# Patient Record
Sex: Male | Born: 1950 | Hispanic: No | Marital: Married | Smoking: Current every day smoker
Health system: Southern US, Community
[De-identification: ages and names within clinical notes are randomized; demographics above are authoritative.]

## PROBLEM LIST (undated history)

## (undated) DIAGNOSIS — E785 Hyperlipidemia, unspecified: Secondary | ICD-10-CM

## (undated) DIAGNOSIS — K219 Gastro-esophageal reflux disease without esophagitis: Secondary | ICD-10-CM

## (undated) DIAGNOSIS — E039 Hypothyroidism, unspecified: Secondary | ICD-10-CM

## (undated) DIAGNOSIS — I10 Essential (primary) hypertension: Secondary | ICD-10-CM

## (undated) HISTORY — PX: HOLEP-LASER ENUCLEATION OF THE PROSTATE WITH MORCELLATION: SHX6641

---

## 2021-01-15 ENCOUNTER — Observation Stay
Admission: EM | Admit: 2021-01-15 | Discharge: 2021-01-17 | Disposition: A | Discharge status: Home / Self Care | Payer: Medicare HMO | Attending: Internal Medicine | Admitting: Internal Medicine

## 2021-01-15 ENCOUNTER — Emergency Department: Payer: Medicare HMO

## 2021-01-15 ENCOUNTER — Other Ambulatory Visit: Payer: Self-pay

## 2021-01-15 DIAGNOSIS — I10 Essential (primary) hypertension: Secondary | ICD-10-CM | POA: Diagnosis not present

## 2021-01-15 DIAGNOSIS — Z9181 History of falling: Secondary | ICD-10-CM | POA: Insufficient documentation

## 2021-01-15 DIAGNOSIS — S79912A Unspecified injury of left hip, initial encounter: Secondary | ICD-10-CM | POA: Diagnosis present

## 2021-01-15 DIAGNOSIS — S3282XA Multiple fractures of pelvis without disruption of pelvic ring, initial encounter for closed fracture: Principal | ICD-10-CM | POA: Insufficient documentation

## 2021-01-15 DIAGNOSIS — Z20822 Contact with and (suspected) exposure to covid-19: Secondary | ICD-10-CM | POA: Diagnosis not present

## 2021-01-15 DIAGNOSIS — R339 Retention of urine, unspecified: Secondary | ICD-10-CM | POA: Diagnosis not present

## 2021-01-15 DIAGNOSIS — K219 Gastro-esophageal reflux disease without esophagitis: Secondary | ICD-10-CM

## 2021-01-15 DIAGNOSIS — E039 Hypothyroidism, unspecified: Secondary | ICD-10-CM

## 2021-01-15 DIAGNOSIS — Z79899 Other long term (current) drug therapy: Secondary | ICD-10-CM | POA: Insufficient documentation

## 2021-01-15 DIAGNOSIS — F17290 Nicotine dependence, other tobacco product, uncomplicated: Secondary | ICD-10-CM | POA: Insufficient documentation

## 2021-01-15 DIAGNOSIS — S32502A Unspecified fracture of left pubis, initial encounter for closed fracture: Secondary | ICD-10-CM

## 2021-01-15 DIAGNOSIS — S32599A Other specified fracture of unspecified pubis, initial encounter for closed fracture: Secondary | ICD-10-CM

## 2021-01-15 DIAGNOSIS — W07XXXA Fall from chair, initial encounter: Secondary | ICD-10-CM | POA: Insufficient documentation

## 2021-01-15 DIAGNOSIS — W19XXXA Unspecified fall, initial encounter: Secondary | ICD-10-CM

## 2021-01-15 DIAGNOSIS — D72829 Elevated white blood cell count, unspecified: Secondary | ICD-10-CM | POA: Diagnosis not present

## 2021-01-15 DIAGNOSIS — R52 Pain, unspecified: Secondary | ICD-10-CM

## 2021-01-15 DIAGNOSIS — R338 Other retention of urine: Secondary | ICD-10-CM

## 2021-01-15 HISTORY — DX: Gastro-esophageal reflux disease without esophagitis: K21.9

## 2021-01-15 HISTORY — DX: Essential (primary) hypertension: I10

## 2021-01-15 HISTORY — DX: Hyperlipidemia, unspecified: E78.5

## 2021-01-15 HISTORY — DX: Hypothyroidism, unspecified: E03.9

## 2021-01-15 NOTE — ED Provider Notes (Incomplete)
[facility] Emergency Department Provider Note   ____________________________________________   Event Date/Time   First MD Initiated Contact with Patient 01/15/21 2355     (approximate)  I have reviewed the triage vital signs and the nursing notes.   HISTORY  Chief Complaint Hip Pain    HPI Steven Warren is a 70 y.o. male who presents to the ED from home status post mechanical fall with left hip pain.  Patient fell out of a chair and onto cement.  Denies striking head or LOC.  Unable to bear weight.  Denies chest pain, shortness of breath, abdominal pain, nausea, vomiting or dizziness.  Denies anticoagulant use.     Past medical history Urinary retention Hyperlipidemia GERD History of ventral hernia There are no problems to display for this patient.   Prior to Admission medications   Not on File    Allergies Patient has no known allergies.  No family history on file.  Social History    Review of Systems {** Revise as appropriate then delete this line - Documentation of 10 systems is required  **} Constitutional: No fever/chills Eyes: No visual changes. ENT: No sore throat. Cardiovascular: Denies chest pain. Respiratory: Denies shortness of breath. Gastrointestinal: No abdominal pain.  No nausea, no vomiting.  No diarrhea.  No constipation. Genitourinary: Negative for dysuria. Musculoskeletal: Negative for back pain. Skin: Negative for rash. Neurological: Negative for headaches, focal weakness or numbness. {**Psychiatric:  Endocrine:  Hematological/Lymphatic:  Allergic/Immunilogical: **}  ____________________________________________   PHYSICAL EXAM:  VITAL SIGNS: ED Triage Vitals  Enc Vitals Group     BP 01/15/21 2301 112/78     Pulse Rate 01/15/21 2301 84     Resp 01/15/21 2301 18     Temp 01/15/21 2301 98.7 F (37.1 C)     Temp Source 01/15/21 2301 Oral     SpO2 01/15/21 2301 98 %     Weight 01/15/21 2300 168 lb  (76.2 kg)     Height 01/15/21 2300 5\' 8"  (1.727 m)     Head Circumference --      Peak Flow --      Pain Score 01/15/21 2259 10     Pain Loc --      Pain Edu? --      Excl. in GC? --    {** Revise as appropriate then delete this line - 8 systems required **} Constitutional: Alert and oriented. Well appearing and in no acute distress. Eyes: Conjunctivae are normal. PERRL. EOMI. Head: Atraumatic. Nose: No congestion/rhinnorhea. Mouth/Throat: Mucous membranes are moist.  Oropharynx non-erythematous. Neck: No stridor.  {**No cervical spine tenderness to palpation.**} {**Hematological/Lymphatic/Immunilogical: No cervical lymphadenopathy. **}Cardiovascular: Normal rate, regular rhythm. Grossly normal heart sounds.  Good peripheral circulation. Respiratory: Normal respiratory effort.  No retractions. Lungs CTAB. Gastrointestinal: Soft and nontender. No distention. No abdominal bruits. No CVA tenderness. {**Genitourinary:  **}Musculoskeletal: No lower extremity tenderness nor edema.  No joint effusions. Neurologic:  Normal speech and language. No gross focal neurologic deficits are appreciated. No gait instability. Skin:  Skin is warm, dry and intact. No rash noted. Psychiatric: Mood and affect are normal. Speech and behavior are normal.  ____________________________________________   LABS (all labs ordered are listed, but only abnormal results are displayed)  Labs Reviewed  CBC WITH DIFFERENTIAL/PLATELET  BASIC METABOLIC PANEL  TROPONIN I (HIGH SENSITIVITY)   ____________________________________________  EKG  *** ____________________________________________  RADIOLOGY I, Amai Cappiello J, personally viewed and evaluated these images (plain radiographs) as part of my medical  decision making, as well as reviewing the written report by the radiologist.  ED MD interpretation:  ***  Official radiology report(s): DG Hip Unilat W or Wo Pelvis 2-3 Views Left  Result Date:  01/15/2021 CLINICAL DATA:  Fall with hip pain EXAM: DG HIP (WITH OR WITHOUT PELVIS) 2-3V LEFT COMPARISON:  None. FINDINGS: Minimally displaced fracture of the left superior pubic ramus and nondisplaced fracture of the left inferior pubic ramus. There is no evidence of arthropathy or other focal bone abnormality. Pelvic phleboliths. Lumbar spondylosis. IMPRESSION: Minimally displaced fracture of the left superior pubic ramus and nondisplaced fracture of the left inferior pubic ramus. Electronically Signed   By: Maudry Mayhew MD   On: 01/15/2021 23:32    ____________________________________________   PROCEDURES  Procedure(s) performed (including Critical Care):  Procedures   ____________________________________________   INITIAL IMPRESSION / ASSESSMENT AND PLAN / ED COURSE  As part of my medical decision making, I reviewed the following data within the electronic MEDICAL RECORD NUMBER {[id number]::"Notes from prior ED visits","[place] Controlled Substance Database"}        ***  Clinical Course as of 01/16/21 0003  Sat Jan 15, 2021  2316 DG Hip Unilat W or Wo Pelvis 2-3 Views Left [JS]    Clinical Course User Index [JS] Irean Hong, MD     ____________________________________________   FINAL CLINICAL IMPRESSION(S) / ED DIAGNOSES  Final diagnoses:  Closed fracture of left pubis, unspecified portion of pubis, initial encounter [facility])     ED Discharge Orders    None      *Please note:  Steven Warren was evaluated in Emergency Department on 01/16/2021 for the symptoms described in the history of present illness. He was evaluated in the context of the global COVID-19 pandemic, which necessitated consideration that the patient might be at risk for infection with the SARS-CoV-2 virus that causes COVID-19. Institutional protocols and algorithms that pertain to the evaluation of patients at risk for COVID-19 are in a state of rapid change based on information released by regulatory bodies  including the CDC and federal and state organizations. These policies and algorithms were followed during the patient's care in the ED.  Some ED evaluations and interventions may be delayed as a result of limited staffing during and the pandemic.*   Note:  This document was prepared using Dragon voice recognition software and may include unintentional dictation errors.

## 2021-01-15 NOTE — ED Provider Notes (Signed)
[facility] Emergency Department Provider Note   ____________________________________________   Event Date/Time   First MD Initiated Contact with Patient 01/15/21 2355     (approximate)  I have reviewed the triage vital signs and the nursing notes.   HISTORY  Chief Complaint Hip Pain    HPI Steven Warren is a 70 y.o. male who presents to the ED from home status post mechanical fall with left hip pain.  Patient fell out of a chair and onto cement.  Denies striking head or LOC.  Unable to bear weight.  Denies chest pain, shortness of breath, abdominal pain, nausea, vomiting or dizziness.  Denies anticoagulant use.     Past medical history Urinary retention Hyperlipidemia GERD History of ventral hernia There are no problems to display for this patient.   Prior to Admission medications   Not on File    Allergies Patient has no known allergies.  No family history on file.  Social History    Review of Systems  Constitutional: No fever/chills Eyes: No visual changes. ENT: No sore throat. Cardiovascular: Denies chest pain. Respiratory: Denies shortness of breath. Gastrointestinal: No abdominal pain.  No nausea, no vomiting.  No diarrhea.  No constipation. Genitourinary: Negative for dysuria. Musculoskeletal: Positive for left hip pain.  Negative for back pain. Skin: Negative for rash. Neurological: Negative for headaches, focal weakness or numbness.   ____________________________________________   PHYSICAL EXAM:  VITAL SIGNS: ED Triage Vitals  Enc Vitals Group     BP 01/15/21 2301 112/78     Pulse Rate 01/15/21 2301 84     Resp 01/15/21 2301 18     Temp 01/15/21 2301 98.7 F (37.1 C)     Temp Source 01/15/21 2301 Oral     SpO2 01/15/21 2301 98 %     Weight 01/15/21 2300 168 lb (76.2 kg)     Height 01/15/21 2300 5\' 8"  (1.727 m)     Head Circumference --      Peak Flow --      Pain Score 01/15/21 2259 10     Pain Loc --       Pain Edu? --      Excl. in GC? --     Constitutional: Alert and oriented. Well appearing and in mild acute distress. Eyes: Conjunctivae are normal. PERRL. EOMI. Head: Atraumatic. Nose: Atraumatic. Mouth/Throat: Mucous membranes are moist.  No dental malocclusion. Neck: No stridor.  No cervical spine tenderness to palpation. Cardiovascular: Normal rate, regular rhythm. Grossly normal heart sounds.  Good peripheral circulation. Respiratory: Normal respiratory effort.  No retractions. Lungs CTAB. Gastrointestinal: Soft and nontender to light or deep palpation. No distention. No abdominal bruits. No CVA tenderness. Musculoskeletal:  LLE: No foreshortening or external rotation.  Pelvis tender to palpation.  Limited range of motion hip secondary to pain.  2+ distal pulses.  Brisk, less than 5-second capillary refill. Neurologic:  Normal speech and language. No gross focal neurologic deficits are appreciated.  Skin:  Skin is warm, dry and intact. No rash noted. Psychiatric: Mood and affect are normal. Speech and behavior are normal.  ____________________________________________   LABS (all labs ordered are listed, but only abnormal results are displayed)  Labs Reviewed  CBC WITH DIFFERENTIAL/PLATELET - Abnormal; Notable for the following components:      Result Value   WBC 17.1 (*)    Neutro Abs 13.4 (*)    Monocytes Absolute 1.2 (*)    Abs Immature Granulocytes 0.20 (*)    All  other components within normal limits  BASIC METABOLIC PANEL - Abnormal; Notable for the following components:   Sodium 133 (*)    Potassium 3.4 (*)    Chloride 96 (*)    All other components within normal limits  RESP PANEL BY RT-PCR (FLU A&B, COVID) ARPGX2  SAMPLE TO BLOOD BANK  TROPONIN I (HIGH SENSITIVITY)  TROPONIN I (HIGH SENSITIVITY)   ____________________________________________  EKG  ED ECG REPORT I, Aubri Gathright J, the attending physician, personally viewed and interpreted this ECG.   Date:  01/16/2021  EKG Time: 0005  Rate: 75  Rhythm: normal EKG, normal sinus rhythm  Axis: Normal  Intervals:none  ST&T Change: Nonspecific  ____________________________________________  RADIOLOGY I, Berta Denson J, personally viewed and evaluated these images (plain radiographs) as part of my medical decision making, as well as reviewing the written report by the radiologist.  ED MD interpretation: Left superior and inferior pubic rami fractures  Official radiology report(s): CT Hip Left Wo Contrast  Result Date: 01/16/2021 CLINICAL DATA:  Left hip pain after fall. Unable to bear weight. Pubic rami fractures on radiograph. EXAM: CT OF THE LEFT HIP WITHOUT CONTRAST TECHNIQUE: Multidetector CT imaging of the left hip was performed according to the standard protocol. Multiplanar CT image reconstructions were also generated. COMPARISON:  None. FINDINGS: Bones/Joint/Cartilage Comminuted and mildly displaced left superior pubic ramus fracture abuts the pubic body, no symphyseal involvement. No acetabular involvement. Minimally comminuted and displaced inferior ramus fracture. The femoral head is well seated in the acetabulum. No femoral or acetabular fracture. There is no hip joint effusion. Ligaments Suboptimally assessed by CT. Muscles and Tendons Minimal obturators stranding adjacent to pubic rami fractures Soft tissues Mild lateral soft tissue edema. The urinary bladder is partially included but markedly distended. Fat in the left inguinal canal. IMPRESSION: 1. Comminuted and mildly displaced left superior and inferior pubic rami fractures. 2. No femoral or acetabular fracture. 3. Markedly distended urinary bladder. Electronically Signed   By: Narda Rutherford M.D.   On: 01/16/2021 00:40   DG Hip Unilat W or Wo Pelvis 2-3 Views Left  Result Date: 01/15/2021 CLINICAL DATA:  Fall with hip pain EXAM: DG HIP (WITH OR WITHOUT PELVIS) 2-3V LEFT COMPARISON:  None. FINDINGS: Minimally displaced fracture of the  left superior pubic ramus and nondisplaced fracture of the left inferior pubic ramus. There is no evidence of arthropathy or other focal bone abnormality. Pelvic phleboliths. Lumbar spondylosis. IMPRESSION: Minimally displaced fracture of the left superior pubic ramus and nondisplaced fracture of the left inferior pubic ramus. Electronically Signed   By: Maudry Mayhew MD   On: 01/15/2021 23:32    ____________________________________________   PROCEDURES  Procedure(s) performed (including Critical Care):  Procedures   ____________________________________________   INITIAL IMPRESSION / ASSESSMENT AND PLAN / ED COURSE  As part of my medical decision making, I reviewed the following data within the electronic MEDICAL RECORD NUMBER History obtained from family, Nursing notes reviewed and incorporated, Labs reviewed, EKG interpreted, Old chart reviewed, Radiograph reviewed and Notes from prior ED visits     70 year old male presenting with left hip pain status post mechanical fall.  Differential diagnosis includes but is not limited to hip fracture, dislocation, pelvic fracture, musculoskeletal contusion, etc.  X-rays demonstrate left pelvic fractures.  Will obtain CT scan for better characterization.  Administer IV morphine for pain.  Obtain basic lab work, COVID swab anticipating patient will require hospitalization for pain control and inability to walk.  Clinical Course as of 01/16/21 0134  Wynelle Link  Jan 16, 2021  0115 Updated patient and spouse on CT results.  Will perform ambulation trial. [JS]  0133 Patient failed ambulation trial. Unable to stand. Will redose analgesia and discuss with hospitalist services for admission. [JS]    Clinical Course User Index [JS] Irean Hong, MD     ____________________________________________   FINAL CLINICAL IMPRESSION(S) / ED DIAGNOSES  Final diagnoses:  Closed fracture of left pubis, unspecified portion of pubis, initial encounter [facility])   Intractable pain     ED Discharge Orders    None      *Please note:  Quashon Jesus was evaluated in Emergency Department on 01/16/2021 for the symptoms described in the history of present illness. He was evaluated in the context of the global COVID-19 pandemic, which necessitated consideration that the patient might be at risk for infection with the SARS-CoV-2 virus that causes COVID-19. Institutional protocols and algorithms that pertain to the evaluation of patients at risk for COVID-19 are in a state of rapid change based on information released by regulatory bodies including the CDC and federal and state organizations. These policies and algorithms were followed during the patient's care in the ED.  Some ED evaluations and interventions may be delayed as a result of limited staffing during and the pandemic.*   Note:  This document was prepared using Dragon voice recognition software and may include unintentional dictation errors.   Irean Hong, MD 01/16/21 [phone]

## 2021-01-15 NOTE — ED Triage Notes (Signed)
Pt reports that his legs got tangled up and fell on cement. He is unable to bear weight.

## 2021-01-16 ENCOUNTER — Encounter: Payer: Self-pay | Admitting: Internal Medicine

## 2021-01-16 ENCOUNTER — Other Ambulatory Visit: Payer: Self-pay

## 2021-01-16 ENCOUNTER — Emergency Department: Payer: Medicare HMO

## 2021-01-16 DIAGNOSIS — E039 Hypothyroidism, unspecified: Secondary | ICD-10-CM

## 2021-01-16 DIAGNOSIS — R338 Other retention of urine: Secondary | ICD-10-CM

## 2021-01-16 DIAGNOSIS — K219 Gastro-esophageal reflux disease without esophagitis: Secondary | ICD-10-CM

## 2021-01-16 DIAGNOSIS — D72829 Elevated white blood cell count, unspecified: Secondary | ICD-10-CM

## 2021-01-16 DIAGNOSIS — S32599A Other specified fracture of unspecified pubis, initial encounter for closed fracture: Secondary | ICD-10-CM | POA: Diagnosis not present

## 2021-01-16 DIAGNOSIS — W19XXXA Unspecified fall, initial encounter: Secondary | ICD-10-CM

## 2021-01-16 DIAGNOSIS — I10 Essential (primary) hypertension: Secondary | ICD-10-CM

## 2021-01-16 LAB — URINALYSIS, COMPLETE (UACMP) WITH MICROSCOPIC
Bacteria, UA: NONE SEEN
Bilirubin Urine: NEGATIVE
Glucose, UA: NEGATIVE mg/dL
Ketones, ur: NEGATIVE mg/dL
Nitrite: NEGATIVE
Protein, ur: NEGATIVE mg/dL
Specific Gravity, Urine: 1.006 (ref 1.005–1.030)
pH: 6 (ref 5.0–8.0)

## 2021-01-16 LAB — CBC WITH DIFFERENTIAL/PLATELET
Abs Immature Granulocytes: 0.2 10*3/uL — ABNORMAL HIGH (ref 0.00–0.07)
Basophils Absolute: 0.1 10*3/uL (ref 0.0–0.1)
Basophils Relative: 1 %
Eosinophils Absolute: 0.3 10*3/uL (ref 0.0–0.5)
Eosinophils Relative: 2 %
HCT: 42.4 % (ref 39.0–52.0)
Hemoglobin: 14.7 g/dL (ref 13.0–17.0)
Immature Granulocytes: 1 %
Lymphocytes Relative: 11 %
Lymphs Abs: 1.9 10*3/uL (ref 0.7–4.0)
MCH: 32 pg (ref 26.0–34.0)
MCHC: 34.7 g/dL (ref 30.0–36.0)
MCV: 92.4 fL (ref 80.0–100.0)
Monocytes Absolute: 1.2 10*3/uL — ABNORMAL HIGH (ref 0.1–1.0)
Monocytes Relative: 7 %
Neutro Abs: 13.4 10*3/uL — ABNORMAL HIGH (ref 1.7–7.7)
Neutrophils Relative %: 78 %
Platelets: 313 10*3/uL (ref 150–400)
RBC: 4.59 MIL/uL (ref 4.22–5.81)
RDW: 13.7 % (ref 11.5–15.5)
WBC: 17.1 10*3/uL — ABNORMAL HIGH (ref 4.0–10.5)
nRBC: 0 % (ref 0.0–0.2)

## 2021-01-16 LAB — BASIC METABOLIC PANEL
Anion gap: 12 (ref 5–15)
BUN: 15 mg/dL (ref 8–23)
CO2: 25 mmol/L (ref 22–32)
Calcium: 10.1 mg/dL (ref 8.9–10.3)
Chloride: 96 mmol/L — ABNORMAL LOW (ref 98–111)
Creatinine, Ser: 0.94 mg/dL (ref 0.61–1.24)
GFR, Estimated: >60 mL/min (ref >60)
Glucose, Bld: 94 mg/dL (ref 70–99)
Potassium: 3.4 mmol/L — ABNORMAL LOW (ref 3.5–5.1)
Sodium: 133 mmol/L — ABNORMAL LOW (ref 135–145)

## 2021-01-16 LAB — RESP PANEL BY RT-PCR (FLU A&B, COVID) ARPGX2
Influenza A by PCR: NEGATIVE
Influenza B by PCR: NEGATIVE
SARS Coronavirus 2 by RT PCR: NEGATIVE

## 2021-01-16 LAB — TROPONIN I (HIGH SENSITIVITY): Troponin I (High Sensitivity): 8 ng/L (ref <18)

## 2021-01-16 MED ORDER — KETOROLAC TROMETHAMINE 30 MG/ML IJ SOLN
15.0000 mg | Freq: Four times a day (QID) | INTRAMUSCULAR | Status: DC | PRN
  Administered 2021-01-17: 15 mg via INTRAVENOUS
  Filled 2021-01-16: qty 1

## 2021-01-16 MED ORDER — HYDROCHLOROTHIAZIDE 25 MG PO TABS
25.0000 mg | ORAL_TABLET | Freq: Every day | ORAL | Status: DC
  Administered 2021-01-16 – 2021-01-17 (×2): 25 mg via ORAL
  Filled 2021-01-16 (×2): qty 1

## 2021-01-16 MED ORDER — ACETAMINOPHEN 325 MG PO TABS: 650.0000 mg | ORAL_TABLET | Freq: Four times a day (QID) | ORAL | Status: DC | PRN

## 2021-01-16 MED ORDER — MORPHINE SULFATE (PF) 4 MG/ML IV SOLN
4.0000 mg | Freq: Once | INTRAVENOUS | Status: AC
  Administered 2021-01-16: 4 mg via INTRAVENOUS
  Filled 2021-01-16: qty 1

## 2021-01-16 MED ORDER — MORPHINE SULFATE (PF) 2 MG/ML IV SOLN
2.0000 mg | INTRAVENOUS | Status: DC | PRN
  Administered 2021-01-16 (×3): 2 mg via INTRAVENOUS
  Filled 2021-01-16 (×3): qty 1

## 2021-01-16 MED ORDER — ONDANSETRON HCL 4 MG/2ML IJ SOLN: 4.0000 mg | Freq: Four times a day (QID) | INTRAMUSCULAR | Status: DC | PRN

## 2021-01-16 MED ORDER — TRAMADOL HCL 50 MG PO TABS: 50.0000 mg | ORAL_TABLET | Freq: Two times a day (BID) | ORAL | 0 refills | Status: DC | PRN

## 2021-01-16 MED ORDER — PANTOPRAZOLE SODIUM 40 MG PO TBEC
40.0000 mg | DELAYED_RELEASE_TABLET | Freq: Every day | ORAL | Status: DC
  Administered 2021-01-16 – 2021-01-17 (×2): 40 mg via ORAL
  Filled 2021-01-16 (×2): qty 1

## 2021-01-16 MED ORDER — IBUPROFEN 800 MG PO TABS: 800.0000 mg | ORAL_TABLET | Freq: Three times a day (TID) | ORAL | 0 refills | Status: DC | PRN

## 2021-01-16 MED ORDER — ACETAMINOPHEN 650 MG RE SUPP: 650.0000 mg | Freq: Four times a day (QID) | RECTAL | Status: DC | PRN

## 2021-01-16 MED ORDER — ENOXAPARIN SODIUM 40 MG/0.4ML IJ SOSY
40.0000 mg | PREFILLED_SYRINGE | INTRAMUSCULAR | Status: DC
  Administered 2021-01-16 – 2021-01-17 (×2): 40 mg via SUBCUTANEOUS
  Filled 2021-01-16 (×2): qty 0.4

## 2021-01-16 MED ORDER — ONDANSETRON HCL 4 MG PO TABS: 4.0000 mg | ORAL_TABLET | Freq: Four times a day (QID) | ORAL | Status: DC | PRN

## 2021-01-16 MED ORDER — HYDROCODONE-ACETAMINOPHEN 5-325 MG PO TABS
1.0000 | ORAL_TABLET | ORAL | Status: DC | PRN
  Administered 2021-01-16 (×2): 1 via ORAL
  Administered 2021-01-16 – 2021-01-17 (×2): 2 via ORAL
  Filled 2021-01-16: qty 1
  Filled 2021-01-16: qty 2
  Filled 2021-01-16: qty 1
  Filled 2021-01-16: qty 2

## 2021-01-16 MED ORDER — KETOROLAC TROMETHAMINE 30 MG/ML IJ SOLN
15.0000 mg | Freq: Once | INTRAMUSCULAR | Status: AC
  Administered 2021-01-16: 15 mg via INTRAVENOUS
  Filled 2021-01-16: qty 1

## 2021-01-16 MED ORDER — LEVOTHYROXINE SODIUM 88 MCG PO TABS
88.0000 ug | ORAL_TABLET | Freq: Every day | ORAL | Status: DC
  Administered 2021-01-16 – 2021-01-17 (×2): 88 ug via ORAL
  Filled 2021-01-16 (×2): qty 1

## 2021-01-16 MED ORDER — BISACODYL 5 MG PO TBEC
5.0000 mg | DELAYED_RELEASE_TABLET | Freq: Every day | ORAL | Status: DC | PRN
Start: 2021-01-16 — End: 2021-01-17

## 2021-01-16 MED ORDER — MORPHINE SULFATE (PF) 4 MG/ML IV SOLN
4.0000 mg | Freq: Once | INTRAVENOUS | Status: AC
Start: 2021-01-16 — End: 2021-01-16
  Administered 2021-01-16: 4 mg via INTRAVENOUS
  Filled 2021-01-16: qty 1

## 2021-01-16 MED ORDER — POTASSIUM CHLORIDE CRYS ER 20 MEQ PO TBCR
40.0000 meq | EXTENDED_RELEASE_TABLET | Freq: Once | ORAL | Status: AC
  Administered 2021-01-16: 40 meq via ORAL
  Filled 2021-01-16: qty 2

## 2021-01-16 MED ORDER — ATORVASTATIN CALCIUM 20 MG PO TABS
40.0000 mg | ORAL_TABLET | Freq: Every day | ORAL | Status: DC
  Administered 2021-01-16 – 2021-01-17 (×2): 40 mg via ORAL
  Filled 2021-01-16 (×2): qty 2

## 2021-01-16 NOTE — TOC Initial Note (Signed)
Transition of Care [facility]) - Initial/Assessment Note    Patient Details  Name: Steven Warren MRN: [id number] Date of Birth: 10/16/1950  [facility] CM/SW Contact:    Maud Deed, LCSW Phone Number: 01/16/2021, 2:18 PM  Clinical Narrative:                 CSW spoke with pt regarding discharge plans and PT recommendations. PT was in agreement with [facility] PT. Pt resides in [place] and will return there upon discharge. CSW began calling agencies in [place] and faxed referral to [facility], waiting on confirmation on if they can accept pt or not.   TOC please follow up with [facility] agencies that will accept pt's insurance.  Expected Discharge Plan: Home w Home Health Services Barriers to Discharge: Continued Medical Work up   Patient Goals and CMS Choice   CMS Medicare.gov Compare Post Acute Care list provided to:: Patient Choice offered to / list presented to : Patient  Expected Discharge Plan and Services Expected Discharge Plan: Home w Home Health Services       Living arrangements for the past 2 months: Single Family Home                                      Prior Living Arrangements/Services Living arrangements for the past 2 months: Single Family Home Lives with:: Spouse Patient language and need for interpreter reviewed:: Yes Do you feel safe going back to the place where you live?: Yes      Need for Family Participation in Patient Care: Yes (Comment) Care giver support system in place?: Yes (comment)   Criminal Activity/Legal Involvement Pertinent to Current Situation/Hospitalization: No - Comment as needed  Activities of Daily Living Home Assistive Devices/Equipment: Eyeglasses ADL Screening (condition at time of admission) Patient's cognitive ability adequate to safely complete daily activities?: Yes Is the patient deaf or have difficulty hearing?: No Does the patient have difficulty seeing, even when wearing  glasses/contacts?: No Does the patient have difficulty concentrating, remembering, or making decisions?: No Patient able to express need for assistance with ADLs?: Yes Does the patient have difficulty dressing or bathing?: Yes Independently performs ADLs?: No Communication: Independent Dressing (OT): Needs assistance Is this a change from baseline?: Change from baseline, expected to last >3 days Grooming: Independent Feeding: Independent Bathing: Needs assistance Is this a change from baseline?: Change from baseline, expected to last >3 days Toileting: Needs assistance Is this a change from baseline?: Change from baseline, expected to last >3days In/Out Bed: Needs assistance Is this a change from baseline?: Change from baseline, expected to last >3 days Walks in Home: Needs assistance Is this a change from baseline?: Change from baseline, expected to last >3 days Does the patient have difficulty walking or climbing stairs?: Yes Weakness of Legs: Left Weakness of Arms/Hands: None  Permission Sought/Granted Permission sought to share information with : Facility Industrial/product designer granted to share information with : Yes, Verbal Permission Granted  Share Information with NAME: Denice     Permission granted to share info w Relationship: spouse  Permission granted to share info w Contact Information: [phone]  Emotional Assessment Appearance:: Other (Comment Required (Unable to assess) Attitude/Demeanor/Rapport: Unable to Assess Affect (typically observed): Unable to Assess Orientation: : Oriented to Self,Oriented to Place,Oriented to  Time,Oriented to Situation   Psych Involvement: No (comment)  Admission diagnosis:  Pelvic  fracture (HCC) [S32.9XXA] Intractable pain [R52] Closed fracture of left pubis, unspecified portion of pubis, initial encounter [facility]) [S32.502A] Patient Active Problem List   Diagnosis Date Noted  . Closed fracture of multiple pubic rami,  initial encounter (HCC) 01/16/2021  . Accidental fall 01/16/2021  . HTN (hypertension) 01/16/2021  . Hypothyroidism 01/16/2021  . GERD (gastroesophageal reflux disease) 01/16/2021  . Leukocytosis 01/16/2021  . Acute urinary retention 01/16/2021   PCP:  [facility]:   [id number] [place] - [place], [place] - [address] [place] [address] Phone: [phone] Fax: [phone]     Social Determinants of Health (SDOH) Interventions    Readmission Risk Interventions No flowsheet data found.

## 2021-01-16 NOTE — Plan of Care (Signed)
  Problem: Education: Goal: Knowledge of General Education information will improve Description: Including pain rating scale, medication(s)/side effects and non-pharmacologic comfort measures Outcome: Progressing   Problem: Clinical Measurements: Goal: Ability to maintain clinical measurements within normal limits will improve Outcome: Progressing   Problem: Coping: Goal: Level of anxiety will decrease Outcome: Progressing   Problem: Elimination: Goal: Will not experience complications related to bowel motility Outcome: Progressing   Problem: Pain Managment: Goal: General experience of comfort will improve Outcome: Progressing   Problem: Safety: Goal: Ability to remain free from injury will improve Outcome: Progressing   Problem: Skin Integrity: Goal: Risk for impaired skin integrity will decrease Outcome: Progressing   

## 2021-01-16 NOTE — ED Notes (Signed)
Pt unable to place any weight on left hip when walking. Pt reports his son had to carry him to the car due to not being able to bear weight.

## 2021-01-16 NOTE — Progress Notes (Signed)
PT Cancellation Note  Patient Details Name: Steven Warren MRN: [id number] DOB: 1950/10/01   Cancelled Treatment:    Reason Eval/Treat Not Completed: Patient not medically ready (Currently awaiting ortho consult d/t new fxs and need for WB clearances and POC. PT will hold until consult and follow up accordingly.)  11:21 AM, 01/16/21 Dewayne Jurek A. Mordecai Maes PT, DPT Physical Therapist - [facility]  Drenda Sobecki A Kamaryn Grimley 01/16/2021, 11:21 AM

## 2021-01-16 NOTE — Consult Note (Signed)
Reason for Consult: Left hip pain secondary to pelvic fracture Referring Physician: Dr. Darrick Huntsman is an 70 y.o. male.  HPI: Patient is a 70 year old who fell while getting up off a chair yesterday.  He lives in the [facility] area and is active and ambulatory without assistive device.  He has had prior orthopedic problems of bilateral biceps tendon ruptures but otherwise active.  He works full-time running a business.  He reports significant left groin pain after his fall.  Past Medical History:  Diagnosis Date  . GERD (gastroesophageal reflux disease)   . HLD (hyperlipidemia)   . HTN (hypertension)   . Hypothyroidism     Past Surgical History:  Procedure Laterality Date  . HOLEP-LASER ENUCLEATION OF THE PROSTATE WITH MORCELLATION      History reviewed. No pertinent family history.  Social History:  reports that he has been smoking cigars. He has never used smokeless tobacco. He reports current alcohol use of about 6.0 standard drinks of alcohol per week. He reports that he does not use drugs.  Allergies: No Known Allergies  Medications: I have reviewed the patient's current medications.  Results for orders placed or performed during the hospital encounter of 01/15/21 (from the past 48 hour(s))  CBC with Differential     Status: Abnormal   Collection Time: 01/15/21 11:55 PM  Result Value Ref Range   WBC 17.1 (H) 4.0 - 10.5 K/uL   RBC 4.59 4.22 - 5.81 MIL/uL   Hemoglobin 14.7 13.0 - 17.0 g/dL   HCT 48.2 50.0 - 37.0 %   MCV 92.4 80.0 - 100.0 fL   MCH 32.0 26.0 - 34.0 pg   MCHC 34.7 30.0 - 36.0 g/dL   RDW 48.8 89.1 - 69.4 %   Platelets 313 150 - 400 K/uL   nRBC 0.0 0.0 - 0.2 %   Neutrophils Relative % 78 %   Neutro Abs 13.4 (H) 1.7 - 7.7 K/uL   Lymphocytes Relative 11 %   Lymphs Abs 1.9 0.7 - 4.0 K/uL   Monocytes Relative 7 %   Monocytes Absolute 1.2 (H) 0.1 - 1.0 K/uL   Eosinophils Relative 2 %   Eosinophils Absolute 0.3 0.0 - 0.5 K/uL    Basophils Relative 1 %   Basophils Absolute 0.1 0.0 - 0.1 K/uL   Immature Granulocytes 1 %   Abs Immature Granulocytes 0.20 (H) 0.00 - 0.07 K/uL    Comment: Performed at [facility], [address] [place], [place] [address]  Basic metabolic panel     Status: Abnormal   Collection Time: 01/15/21 11:55 PM  Result Value Ref Range   Sodium 133 (L) 135 - 145 mmol/L   Potassium 3.4 (L) 3.5 - 5.1 mmol/L   Chloride 96 (L) 98 - 111 mmol/L   CO2 25 22 - 32 mmol/L   Glucose, Bld 94 70 - 99 mg/dL    Comment: Glucose reference range applies only to samples taken after fasting for at least 8 hours.   BUN 15 8 - 23 mg/dL   Creatinine, Ser 8.28 0.61 - 1.24 mg/dL   Calcium 00.3 8.9 - 49.1 mg/dL   GFR, Estimated >79 >15 mL/min    Comment: (NOTE) Calculated using the CKD-EPI Creatinine Equation (2021)    Anion gap 12 5 - 15    Comment: Performed at [facility], [address], [place], [place] [address]  Troponin I (High Sensitivity)     Status: None   Collection Time: 01/15/21  11:55 PM  Result Value Ref Range   Troponin I (High Sensitivity) 8 <18 ng/L    Comment: (NOTE) Elevated high sensitivity troponin I (hsTnI) values and significant  changes across serial measurements may suggest ACS but many other  chronic and acute conditions are known to elevate hsTnI results.  Refer to the "Links" section for chest pain algorithms and additional  guidance. Performed at [facility], [address] [place], [place] [address]   Sample to Blood Bank     Status: None (Preliminary result)   Collection Time: 01/15/21 11:55 PM  Result Value Ref Range   Blood Bank Specimen PENDING    Sample Expiration      01/18/2021,2359 Performed at [facility], [address] [place], [place] [address]   Resp Panel by RT-PCR (Flu A&B, Covid) Nasopharyngeal Swab     Status: None   Collection Time: 01/16/21 12:05 AM   Specimen: Nasopharyngeal Swab; Nasopharyngeal(NP) swabs  in vial transport medium  Result Value Ref Range   SARS Coronavirus 2 by RT PCR NEGATIVE NEGATIVE    Comment: (NOTE) SARS-CoV-2 target nucleic acids are NOT DETECTED.  The SARS-CoV-2 RNA is generally detectable in upper respiratory specimens during the acute phase of infection. The lowest concentration of SARS-CoV-2 viral copies this assay can detect is 138 copies/mL. A negative result does not preclude SARS-Cov-2 infection and should not be used as the sole basis for treatment or other patient management decisions. A negative result may occur with  improper specimen collection/handling, submission of specimen other than nasopharyngeal swab, presence of viral mutation(s) within the areas targeted by this assay, and inadequate number of viral copies(<138 copies/mL). A negative result must be combined with clinical observations, patient history, and epidemiological information. The expected result is Negative.  Fact Sheet for Patients:  BloggerCourse.com  Fact Sheet for Healthcare Providers:  SeriousBroker.it  This test is no t yet approved or cleared by the [place] FDA and  has been authorized for detection and/or diagnosis of SARS-CoV-2 by FDA under an Emergency Use Authorization (EUA). This EUA will remain  in effect (meaning this test can be used) for the duration of the COVID-19 declaration under Section 564(b)(1) of the Act, 21 U.S.C.section 360bbb-3(b)(1), unless the authorization is terminated  or revoked sooner.       Influenza A by PCR NEGATIVE NEGATIVE   Influenza B by PCR NEGATIVE NEGATIVE    Comment: (NOTE) The Xpert Xpress SARS-CoV-2/FLU/RSV plus assay is intended as an aid in the diagnosis of influenza from Nasopharyngeal swab specimens and should not be used as a sole basis for treatment. Nasal washings and aspirates are unacceptable for Xpert Xpress SARS-CoV-2/FLU/RSV testing.  Fact Sheet for  Patients: BloggerCourse.com  Fact Sheet for Healthcare Providers: SeriousBroker.it  This test is not yet approved or cleared by the [place] FDA and has been authorized for detection and/or diagnosis of SARS-CoV-2 by FDA under an Emergency Use Authorization (EUA). This EUA will remain in effect (meaning this test can be used) for the duration of the COVID-19 declaration under Section 564(b)(1) of the Act, 21 U.S.C. section 360bbb-3(b)(1), unless the authorization is terminated or revoked.  Performed at [facility], [address] [place], [place] [address]   Urinalysis, Complete w Microscopic     Status: Abnormal   Collection Time: 01/16/21  3:52 AM  Result Value Ref Range   Color, Urine STRAW (A) YELLOW   APPearance CLEAR (A) CLEAR   Specific Gravity, Urine 1.006 1.005 -  1.030   pH 6.0 5.0 - 8.0   Glucose, UA NEGATIVE NEGATIVE mg/dL   Hgb urine dipstick SMALL (A) NEGATIVE   Bilirubin Urine NEGATIVE NEGATIVE   Ketones, ur NEGATIVE NEGATIVE mg/dL   Protein, ur NEGATIVE NEGATIVE mg/dL   Nitrite NEGATIVE NEGATIVE   Leukocytes,Ua SMALL (A) NEGATIVE   RBC / HPF 0-5 0 - 5 RBC/hpf   WBC, UA 21-50 0 - 5 WBC/hpf   Bacteria, UA NONE SEEN NONE SEEN   Squamous Epithelial / LPF 0-5 0 - 5   Hyaline Casts, UA PRESENT     Comment: Performed at [facility], [address], [place], [place] [address]    CT Hip Left Wo Contrast  Result Date: 01/16/2021 CLINICAL DATA:  Left hip pain after fall. Unable to bear weight. Pubic rami fractures on radiograph. EXAM: CT OF THE LEFT HIP WITHOUT CONTRAST TECHNIQUE: Multidetector CT imaging of the left hip was performed according to the standard protocol. Multiplanar CT image reconstructions were also generated. COMPARISON:  None. FINDINGS: Bones/Joint/Cartilage Comminuted and mildly displaced left superior pubic ramus fracture abuts the pubic body, no symphyseal involvement. No  acetabular involvement. Minimally comminuted and displaced inferior ramus fracture. The femoral head is well seated in the acetabulum. No femoral or acetabular fracture. There is no hip joint effusion. Ligaments Suboptimally assessed by CT. Muscles and Tendons Minimal obturators stranding adjacent to pubic rami fractures Soft tissues Mild lateral soft tissue edema. The urinary bladder is partially included but markedly distended. Fat in the left inguinal canal. IMPRESSION: 1. Comminuted and mildly displaced left superior and inferior pubic rami fractures. 2. No femoral or acetabular fracture. 3. Markedly distended urinary bladder. Electronically Signed   By: Narda Rutherford M.D.   On: 01/16/2021 00:40   DG Hip Unilat W or Wo Pelvis 2-3 Views Left  Result Date: 01/15/2021 CLINICAL DATA:  Fall with hip pain EXAM: DG HIP (WITH OR WITHOUT PELVIS) 2-3V LEFT COMPARISON:  None. FINDINGS: Minimally displaced fracture of the left superior pubic ramus and nondisplaced fracture of the left inferior pubic ramus. There is no evidence of arthropathy or other focal bone abnormality. Pelvic phleboliths. Lumbar spondylosis. IMPRESSION: Minimally displaced fracture of the left superior pubic ramus and nondisplaced fracture of the left inferior pubic ramus. Electronically Signed   By: Maudry Mayhew MD   On: 01/15/2021 23:32    Review of Systems Blood pressure 126/83, pulse 73, temperature 98.1 F (36.7 C), resp. rate 16, height 5\' 8"  (1.727 m), weight 76.2 kg, SpO2 98 %. Physical Exam He has no pain with logrolling of the left lower extremity is tender to palpation at the pubis on the left side.  He has strong pulses and is able flex extend the toes without difficulty negative Homans. Assessment/Plan: Minimally displaced comminuted pubic rami fractures on the left side near the pubic symphysis.  He will be weightbearing as tolerated and will need a walker.  Discussed that this usually takes about a month to be  comfortable and he can follow-up with his local orthopedist regarding this in 3 to 4 weeks.  01/16/2021, 11:28 AM

## 2021-01-16 NOTE — Progress Notes (Signed)
Same day rounding progress note  Patient seen and examined.  Chart reviewed.  Case discussed with orthopedic and nursing.  Family updated at bedside on 5/15  Minimally displaced comminuted pubic rami fracture: Appreciate orthopedic input.  Weightbearing as tolerated, PT, OT eval  Acute urinary retention Required Foley catheterization on admission.  We will give voiding trial today after removing Foley.  Hopefully he will need to get this reinserted  Time spent: 20 minutes

## 2021-01-16 NOTE — Evaluation (Signed)
Physical Therapy Evaluation Patient Details Name: Steven Warren MRN: [id number] DOB: Mar 08, 1951 Today's Date: 01/16/2021   History of Present Illness  Marquelle Musgrave is a 70 y.o. male who presents to the ED from home status post mechanical fall with left hip pain.  Patient fell out of a chair and onto cement.  Denies striking head or LOC. He has PMHx significant for urinary retention, HLD, GERD, and hx of ventral hernia  Clinical Impression  The pt presents this session with good motivation to participate. Currently he is highly limited d/t pain tolerance. The pt is able to tolerant bed mobility and gait training, however reports 9/10 pain at the end of session, RN notified. The pt will require additional gait and stair training in order to safely d/c home with family care and HHPT. Pt and family educated on potential need for downstairs living until able to tolerate x10 steps. PT will continue to follow.     Follow Up Recommendations Home health PT;Supervision/Assistance - 24 hour    Equipment Recommendations       Recommendations for Other Services       Precautions / Restrictions Precautions Precautions: Fall Restrictions Weight Bearing Restrictions: Yes LLE Weight Bearing: Weight bearing as tolerated      Mobility  Bed Mobility Overal bed mobility: Needs Assistance Bed Mobility: Rolling;Supine to Sit Rolling: Min assist (Roll to R with pillow placed between legs)   Supine to sit: Min assist     General bed mobility comments: Limited 2/2 pain. HOB flat    Transfers Overall transfer level: Needs assistance Equipment used: Rolling walker (2 wheeled) Transfers: Sit to/from Stand Sit to Stand: Min guard            Ambulation/Gait Ambulation/Gait assistance: Min guard Gait Distance (Feet): 25 Feet Assistive device: Rolling walker (2 wheeled) Gait Pattern/deviations: Step-to pattern;Decreased step length - right        Stairs            Wheelchair  Mobility    Modified Rankin (Stroke Patients Only)       Balance Overall balance assessment: Needs assistance Sitting-balance support: Bilateral upper extremity supported Sitting balance-Leahy Scale: Fair     Standing balance support: Bilateral upper extremity supported Standing balance-Leahy Scale: Fair                               Pertinent Vitals/Pain Pain Assessment: 0-10 Pain Score: 7  Pain Location: L groin region Pain Descriptors / Indicators: Tightness Pain Intervention(s): Monitored during session;Limited activity within patient's tolerance;Patient requesting pain meds-RN notified (Pt also requesting ice with pain medication-RN notified.)    Home Living Family/patient expects to be discharged to:: Private residence Living Arrangements: Spouse/significant other Available Help at Discharge: Family;Available 24 hours/day Type of Home: House Home Access: Stairs to enter Entrance Stairs-Rails: None Entrance Stairs-Number of Steps: 2 Home Layout: Two level Home Equipment: Environmental consultant - 2 wheels      Prior Function Level of Independence: Independent               Hand Dominance   Dominant Hand: Right    Extremity/Trunk Assessment   Upper Extremity Assessment Upper Extremity Assessment: Overall WFL for tasks assessed    Lower Extremity Assessment Lower Extremity Assessment: RLE deficits/detail;LLE deficits/detail RLE Deficits / Details: Limited SLR d/t L pelvis pain, otherwise WFL LLE Deficits / Details: Limited hip AROM, ankle 5/5       Communication  Communication: No difficulties  Cognition Arousal/Alertness: Awake/alert Behavior During Therapy: WFL for tasks assessed/performed Overall Cognitive Status: Within Functional Limits for tasks assessed                                        General Comments      Exercises Total Joint Exercises Quad Sets: AROM;Both;Supine;Left (d/t pain patient educated and  demonstrated, but did not complete a specific ex rx) Knee Flexion: AROM;Left;Supine (d/t pain patient educated and demonstrated, but did not complete a specific ex rx) Marching in Standing: AROM;Standing;10 reps Other Exercises Other Exercises: Hip IR/ER: LLE; d/t pain patient educated and demonstrated, but did not complete a specific ex rx   Assessment/Plan    PT Assessment Patient needs continued PT services  PT Problem List Decreased activity tolerance;Decreased mobility;Decreased strength;Decreased balance       PT Treatment Interventions Stair training;Balance training;Patient/family education;Therapeutic exercise;Gait training;Therapeutic activities    PT Goals (Current goals can be found in the Care Plan section)  Acute Rehab PT Goals Patient Stated Goal: to return home PT Goal Formulation: With patient Time For Goal Achievement: 01/30/21 Potential to Achieve Goals: Good    Frequency 7X/week   Barriers to discharge        Co-evaluation               AM-PAC PT "6 Clicks" Mobility  Outcome Measure Help needed turning from your back to your side while in a flat bed without using bedrails?: A Lot Help needed moving from lying on your back to sitting on the side of a flat bed without using bedrails?: A Lot Help needed moving to and from a bed to a chair (including a wheelchair)?: A Little Help needed standing up from a chair using your arms (e.g., wheelchair or bedside chair)?: A Little Help needed to walk in hospital room?: A Little Help needed climbing 3-5 steps with a railing? : A Lot 6 Click Score: 15    End of Session   Activity Tolerance: Patient tolerated treatment well;Patient limited by pain Patient left: in bed;with call bell/phone within reach;with bed alarm set Nurse Communication: Mobility status;Patient requests pain meds PT Visit Diagnosis: Unsteadiness on feet (R26.81);Pain;History of falling (Z91.81);Difficulty in walking, not elsewhere classified  (R26.2) Pain - Right/Left: Left Pain - part of body: Hip    Time: 2330-0762 PT Time Calculation (min) (ACUTE ONLY): 36 min   Charges:   PT Evaluation $PT Eval Moderate Complexity: 1 Mod PT Treatments $Gait Training: 23-37 mins        12:36 PM, 01/16/21 Perla Echavarria A. Mordecai Maes PT, DPT Physical Therapist - [facility]   Carely Nappier A Dorna Mallet 01/16/2021, 12:33 PM

## 2021-01-16 NOTE — H&P (Signed)
History and Physical    Steven Warren KZS:[id number] DOB: 04/29/1951 DOA: 01/15/2021  PCP: [facility]   Patient coming from: Home  I have personally briefly reviewed patient's old medical records in [facility]  Chief Complaint: Fall, left hip pain  HPI: Steven Warren is a 70 y.o. male with medical history significant for Visiting the area from [place] with history of HTN, hypothyroidism, GERD, status post recent HoLEP who presents to the ED following an accidental fall.  Patient was in his usual state of health and said he pushed himself up from a chair but lost his footing falling onto his left hip with sudden onset of pain and was unable to help himself up and bear weight on his left leg thereafter.  He denies preceding lightheadedness, palpitations or chest pain or shortness of breath.  Had no preceding headache, visual disturbance or one-sided weakness, numbness or tingling.  Had no recent cough, fever or chills, abdominal pain, nausea vomiting or change in bowel habits. ED course: On arrival, afebrile, BP 112/78, pulse 84, O2 sat 98% on room air.  WBC 17,000, sodium 133, potassium 3.4.  Troponin 8.  COVID and flu negative EKG, personally viewed and interpreted: Normal sinus rhythm at 75 with nonspecific ST-T wave changes Imaging: CT left hip: Comminuted and mildly displaced left superior and inferior pubic rami fractures, no femoral acetabular fracture.  Markedly distended urinary bladder  Patient failed ambulation trial in the ER.  Hospitalist consulted for admission.  Review of Systems: As per HPI otherwise all other systems on review of systems negative.    Past Medical History:  Diagnosis Date  . GERD (gastroesophageal reflux disease)   . HLD (hyperlipidemia)   . HTN (hypertension)   . Hypothyroidism     Past Surgical History:  Procedure Laterality Date  . HOLEP-LASER ENUCLEATION OF THE PROSTATE WITH MORCELLATION       reports that he  has been smoking cigars. He has never used smokeless tobacco. He reports current alcohol use of about 6.0 standard drinks of alcohol per week. He reports that he does not use drugs.  No Known Allergies  History reviewed. No pertinent family history.    Prior to Admission medications   Medication Sig Start Date End Date Taking? Authorizing Provider  amLODipine-benazepril (LOTREL) 10-20 MG capsule Take 1 capsule by mouth daily. 11/17/20   [provider]  atorvastatin (LIPITOR) 80 MG tablet Take 40 mg by mouth daily. 01/03/21   [provider]  gabapentin (NEURONTIN) 300 MG capsule Take 1 capsule by mouth 3 (three) times daily. 01/01/21   [provider]  hydrochlorothiazide (HYDRODIURIL) 25 MG tablet Take 1 tablet by mouth daily. 01/11/21   [provider]  levothyroxine (SYNTHROID) 88 MCG tablet Take 88 mcg by mouth daily. 10/24/20   [provider]  omeprazole (PRILOSEC) 40 MG capsule Take 1 capsule by mouth daily. 01/12/21   [provider]    Physical Exam: Vitals:   01/16/21 0030 01/16/21 0100 01/16/21 0130 01/16/21 0200  BP: 131/90 118/85 119/83 121/75  Pulse: 81 87 85 89  Resp: 19 16 14 18   Temp:      TempSrc:      SpO2: 97% 92% 94% 95%  Weight:      Height:         Vitals:   01/16/21 0030 01/16/21 0100 01/16/21 0130 01/16/21 0200  BP: 131/90 118/85 119/83 121/75  Pulse: 81 87 85 89  Resp: 19 16 14  18  Temp:      TempSrc:      SpO2: 97% 92% 94% 95%  Weight:      Height:          Constitutional: Alert and oriented x 3 . Not in any apparent distress HEENT:      Head: Normocephalic and atraumatic.         Eyes: PERLA, EOMI, Conjunctivae are normal. Sclera is non-icteric.       Mouth/Throat: Mucous membranes are moist.       Neck: Supple with no signs of meningismus. Cardiovascular: Regular rate and rhythm. No murmurs, gallops, or rubs. 2+ symmetrical distal pulses are present . No JVD. No LE edema Respiratory:  Respiratory effort normal .Lungs sounds clear bilaterally. No wheezes, crackles, or rhonchi.  Gastrointestinal: Soft, non tender, mild distention with suprapubic discomfort on palpation, normal positive bowel sounds.  Genitourinary: No CVA tenderness. Musculoskeletal:  Pain on palpation left hip. No cyanosis, or erythema of extremities. Neurologic:  Face is symmetric. Moving all extremities. No gross focal neurologic deficits . Skin: Skin is warm, dry.  No rash or ulcers Psychiatric: Mood and affect are normal    Labs on Admission: I have personally reviewed following labs and imaging studies  CBC: Recent Labs  Lab 01/15/21 2355  WBC 17.1*  NEUTROABS 13.4*  HGB 14.7  HCT 42.4  MCV 92.4  PLT 313   Basic Metabolic Panel: Recent Labs  Lab 01/15/21 2355  NA 133*  K 3.4*  CL 96*  CO2 25  GLUCOSE 94  BUN 15  CREATININE 0.94  CALCIUM 10.1   GFR: Estimated Creatinine Clearance: 73.8 mL/min (by C-G formula based on SCr of 0.94 mg/dL). Liver Function Tests: No results for input(s): AST, ALT, ALKPHOS, BILITOT, PROT, ALBUMIN in the last 168 hours. No results for input(s): LIPASE, AMYLASE in the last 168 hours. No results for input(s): AMMONIA in the last 168 hours. Coagulation Profile: No results for input(s): INR, PROTIME in the last 168 hours. Cardiac Enzymes: No results for input(s): CKTOTAL, CKMB, CKMBINDEX, TROPONINI in the last 168 hours. BNP (last 3 results) No results for input(s): PROBNP in the last 8760 hours. HbA1C: No results for input(s): HGBA1C in the last 72 hours. CBG: No results for input(s): GLUCAP in the last 168 hours. Lipid Profile: No results for input(s): CHOL, HDL, LDLCALC, TRIG, CHOLHDL, LDLDIRECT in the last 72 hours. Thyroid Function Tests: No results for input(s): TSH, T4TOTAL, FREET4, T3FREE, THYROIDAB in the last 72 hours. Anemia Panel: No results for input(s): VITAMINB12, FOLATE, FERRITIN, TIBC, IRON, RETICCTPCT in the last 72 hours. Urine  analysis: No results found for: COLORURINE, APPEARANCEUR, LABSPEC, PHURINE, GLUCOSEU, HGBUR, BILIRUBINUR, KETONESUR, PROTEINUR, UROBILINOGEN, NITRITE, LEUKOCYTESUR  Radiological Exams on Admission: CT Hip Left Wo Contrast  Result Date: 01/16/2021 CLINICAL DATA:  Left hip pain after fall. Unable to bear weight. Pubic rami fractures on radiograph. EXAM: CT OF THE LEFT HIP WITHOUT CONTRAST TECHNIQUE: Multidetector CT imaging of the left hip was performed according to the standard protocol. Multiplanar CT image reconstructions were also generated. COMPARISON:  None. FINDINGS: Bones/Joint/Cartilage Comminuted and mildly displaced left superior pubic ramus fracture abuts the pubic body, no symphyseal involvement. No acetabular involvement. Minimally comminuted and displaced inferior ramus fracture. The femoral head is well seated in the acetabulum. No femoral or acetabular fracture. There is no hip joint effusion. Ligaments Suboptimally assessed by CT. Muscles and Tendons Minimal obturators stranding adjacent to pubic rami fractures Soft tissues Mild lateral soft tissue edema. The urinary  bladder is partially included but markedly distended. Fat in the left inguinal canal. IMPRESSION: 1. Comminuted and mildly displaced left superior and inferior pubic rami fractures. 2. No femoral or acetabular fracture. 3. Markedly distended urinary bladder. Electronically Signed   By: Narda Rutherford M.D.   On: 01/16/2021 00:40   DG Hip Unilat W or Wo Pelvis 2-3 Views Left  Result Date: 01/15/2021 CLINICAL DATA:  Fall with hip pain EXAM: DG HIP (WITH OR WITHOUT PELVIS) 2-3V LEFT COMPARISON:  None. FINDINGS: Minimally displaced fracture of the left superior pubic ramus and nondisplaced fracture of the left inferior pubic ramus. There is no evidence of arthropathy or other focal bone abnormality. Pelvic phleboliths. Lumbar spondylosis. IMPRESSION: Minimally displaced fracture of the left superior pubic ramus and nondisplaced  fracture of the left inferior pubic ramus. Electronically Signed   By: Maudry Mayhew MD   On: 01/15/2021 23:32     Assessment/Plan 70 year old male, visiting from [place], with history of HTN, hypothyroidism, GERD, status post recent HoLEP presenting following an accidental fall onto left hip with inability to bear weight on hip.      Closed fracture of multiple pubic rami, initial encounter (HCC)   Accidental fall -CT left hip: Comminuted and mildly displaced left superior and inferior pubic rami fractures - Pain control - PT and TOC consult - Ortho consult in the morning for additional recommendations - Patient would prefer to continue rehab in [place]    Acute urinary retention   Recent HoLEP   Leukocytosis - CT hip showed markedly distended urinary bladder and patient had recent HoLEP - Leukocytosis of 17,000 - Bladder scan and urinalysis ordered following admission with plan to straight cath if greater than 300     HTN (hypertension) - Continue HCTZ    Hypothyroidism - Continue levothyroxine    GERD (gastroesophageal reflux disease) - Pantoprazole    DVT prophylaxis: Lovenox  Code Status: full code  Family Communication:  none  Disposition Plan: Back to previous home environment Consults called: none  Status: Observation    Andris Baumann MD [facility]     01/16/2021, 2:31 AM

## 2021-01-17 DIAGNOSIS — R52 Pain, unspecified: Secondary | ICD-10-CM | POA: Diagnosis not present

## 2021-01-17 DIAGNOSIS — S32502A Unspecified fracture of left pubis, initial encounter for closed fracture: Secondary | ICD-10-CM | POA: Diagnosis not present

## 2021-01-17 DIAGNOSIS — W19XXXA Unspecified fall, initial encounter: Secondary | ICD-10-CM | POA: Diagnosis not present

## 2021-01-17 DIAGNOSIS — S32599A Other specified fracture of unspecified pubis, initial encounter for closed fracture: Secondary | ICD-10-CM | POA: Diagnosis not present

## 2021-01-17 LAB — BASIC METABOLIC PANEL
Anion gap: 6 (ref 5–15)
BUN: 13 mg/dL (ref 8–23)
CO2: 27 mmol/L (ref 22–32)
Calcium: 9.4 mg/dL (ref 8.9–10.3)
Chloride: 101 mmol/L (ref 98–111)
Creatinine, Ser: 0.85 mg/dL (ref 0.61–1.24)
GFR, Estimated: >60 mL/min (ref >60)
Glucose, Bld: 112 mg/dL — ABNORMAL HIGH (ref 70–99)
Potassium: 3.9 mmol/L (ref 3.5–5.1)
Sodium: 134 mmol/L — ABNORMAL LOW (ref 135–145)

## 2021-01-17 LAB — CBC
HCT: 40.1 % (ref 39.0–52.0)
Hemoglobin: 13.9 g/dL (ref 13.0–17.0)
MCH: 32.1 pg (ref 26.0–34.0)
MCHC: 34.7 g/dL (ref 30.0–36.0)
MCV: 92.6 fL (ref 80.0–100.0)
Platelets: 248 10*3/uL (ref 150–400)
RBC: 4.33 MIL/uL (ref 4.22–5.81)
RDW: 13.8 % (ref 11.5–15.5)
WBC: 8.5 10*3/uL (ref 4.0–10.5)
nRBC: 0 % (ref 0.0–0.2)

## 2021-01-17 LAB — HIV ANTIBODY (ROUTINE TESTING W REFLEX): HIV Screen 4th Generation wRfx: NONREACTIVE

## 2021-01-17 MED ORDER — IBUPROFEN 800 MG PO TABS: 800.0000 mg | ORAL_TABLET | Freq: Three times a day (TID) | ORAL | 0 refills | Status: AC | PRN

## 2021-01-17 MED ORDER — TRAMADOL HCL 50 MG PO TABS: 50.0000 mg | ORAL_TABLET | Freq: Two times a day (BID) | ORAL | 0 refills | Status: AC | PRN

## 2021-01-17 NOTE — Progress Notes (Signed)
Physical Therapy Treatment Patient Details Name: Steven Warren MRN: [id number] DOB: 1951/08/23 Today's Date: 01/17/2021    History of Present Illness Steven Warren is a 70 y.o. male who presents to the ED from home status post mechanical fall with left hip pain.  Patient fell out of a chair and onto cement.  Denies striking head or LOC. He has PMHx significant for urinary retention, HLD, GERD, and hx of ventral hernia    PT Comments    Pt appears to be moving with more ease this am.  Pain level has decreased to 7/10 with weight bearing activities without receiving pain meds.  Pt tolerated 60ft x 2 of gait training to and from rehab gym.  Negotiated 4 steps with bilateral rails and CGA.  Pt's son will assist him on Left side upon returning home since he only has a single rail at home.  Pt however has a good understanding of technique and will premedicate prior to arriving home.  Plan for continued PT services via Home Health.  No equipment needs at this time.     Follow Up Recommendations  Home health PT;Supervision/Assistance - 24 hour     Equipment Recommendations       Recommendations for Other Services       Precautions / Restrictions Precautions Precautions: Fall Restrictions Weight Bearing Restrictions: Yes LLE Weight Bearing: Weight bearing as tolerated    Mobility  Bed Mobility Overal bed mobility: Needs Assistance Bed Mobility: Rolling;Supine to Sit Rolling: Min assist   Supine to sit: Min assist     General bed mobility comments: MinA for L LE management    Transfers Overall transfer level: Needs assistance Equipment used: Rolling walker (2 wheeled) Transfers: Sit to/from Stand Sit to Stand: Supervision            Ambulation/Gait Ambulation/Gait assistance: Supervision Gait Distance (Feet): 100 Feet Assistive device: Rolling walker (2 wheeled) Gait Pattern/deviations: Step-to pattern;Decreased step length - right     General Gait Details: 5ft  2   Stairs Stairs: Yes Stairs assistance: Min guard Stair Management: Two rails;Step to pattern Number of Stairs: 4 General stair comments: Pt has a single Right sided rail at home with 10 steps, his son will help with support on the Left side, he is also capable of utilizing two hands on Right rail for stair negotiation.   Wheelchair Mobility    Modified Rankin (Stroke Patients Only)       Balance                                            Cognition Arousal/Alertness: Awake/alert Behavior During Therapy: WFL for tasks assessed/performed Overall Cognitive Status: Within Functional Limits for tasks assessed                                        Exercises      General Comments        Pertinent Vitals/Pain Pain Assessment: 0-10 Pain Score: 7  Pain Location: L groin region Pain Descriptors / Indicators: Discomfort Pain Intervention(s): Monitored during session    Home Living                      Prior Function  PT Goals (current goals can now be found in the care plan section) Acute Rehab PT Goals Patient Stated Goal: to return home Progress towards PT goals: Progressing toward goals    Frequency    7X/week      PT Plan Current plan remains appropriate    Co-evaluation              AM-PAC PT "6 Clicks" Mobility   Outcome Measure  Help needed turning from your back to your side while in a flat bed without using bedrails?: A Little Help needed moving from lying on your back to sitting on the side of a flat bed without using bedrails?: A Little Help needed moving to and from a bed to a chair (including a wheelchair)?: A Little Help needed standing up from a chair using your arms (e.g., wheelchair or bedside chair)?: A Little Help needed to walk in hospital room?: A Little Help needed climbing 3-5 steps with a railing? : A Little 6 Click Score: 18    End of Session Equipment Utilized  During Treatment: Gait belt Activity Tolerance: Patient tolerated treatment well Patient left: in chair;with call bell/phone within reach Nurse Communication: Mobility status PT Visit Diagnosis: Unsteadiness on feet (R26.81);Pain;History of falling (Z91.81);Difficulty in walking, not elsewhere classified (R26.2) Pain - Right/Left: Left Pain - part of body: Hip     Time: 1010-1100 PT Time Calculation (min) (ACUTE ONLY): 50 min  Charges:  $Gait Training: 23-37 mins $Therapeutic Exercise: 8-22 mins                    Zadie Cleverly, PTA    Jannet Askew 01/17/2021, 11:05 AM

## 2021-01-17 NOTE — TOC Progression Note (Signed)
Transition of Care [facility] - Progression Note    Patient Details  Name: Steven Warren MRN: [id number] Date of Birth: 08-21-1951  Transition of [facility]) CM/SW Contact  Barrie Dunker, RN Phone Number: 01/17/2021, 9:09 AM  Clinical Narrative:    Spoke with Health at home in [place] and they accepted the patient, Faxed Orders and face sheet to [phone] including the patient's PCP information   Expected Discharge Plan: Home w Home Health Services Barriers to Discharge: Continued Medical Work up  Expected Discharge Plan and Services Expected Discharge Plan: Home w Home Health Services       Living arrangements for the past 2 months: Single Family Home Expected Discharge Date: 01/17/21                                     Social Determinants of Health (SDOH) Interventions    Readmission Risk Interventions No flowsheet data found.

## 2021-01-17 NOTE — Discharge Instructions (Signed)
Simple Pelvic Fracture, Adult A pelvic fracture is a break in one of the bones in the pelvis. The pelvic bones include the bones that you sit on and the bones that make up the lower part of your spine. A pelvic fracture is called simple if:  There is only one break.  The broken bone is stable.  The broken bone is not moving out of place.  The bone does not pierce the skin. A pelvic fracture may occur along with injuries to nerves, blood vessels, soft tissues, the urinary tract, and abdominal organs. What are the causes? Common causes of this type of fracture include:  A fall.  A car accident.  Force or pressure that hits the pelvis. What increases the risk? You are more likely to get this injury if you:  Play high-impact sports, such as rugby or football.  You have thinning or weakening of your bones, such as from osteopenia or osteoporosis.  Have cancer that has spread to the bone.  Have a condition that is associated with falling, such as Parkinson's disease or seizure.  Have had a stroke.  Smoke. What are the signs or symptoms? Signs and symptoms may include:  Tenderness, swelling, or bruising in the affected area.  Pain when moving the hip.  Pain when walking or standing. How is this diagnosed? This condition is diagnosed with a physical exam, X-ray, or CT scan. You may also have blood or urine tests:  To rule out damage to other organs, such as the urethra.  To check for internal bleeding in the pelvic area. How is this treated? The goal of treatment is to get the bone to heal in its original position. Treatment includes:  Staying in bed (bed rest).  Using crutches, a walker, or a wheelchair until the bone heals.  Medicines to treat pain.  Medicines to prevent blood clots from forming in your legs.  Physical therapy. Follow these instructions at home: Medicines  Take over-the-counter and prescription medicines only as told by your health care  provider.  Do not drive or use heavy machinery while taking prescription pain medicine. Managing pain, stiffness, and swelling  If directed, apply ice to the injured area: ? Put ice in a plastic bag. ? Place a towel between your skin and the bag. ? Leave the ice on for 20 minutes, 2-3 times a day.  Gently move your toes often to avoid stiffness and to lessen swelling.   Activity  Stay on bed rest for as long as directed by your health care provider.  While on bed rest: ? Change the position of your legs every 1-2 hours. This keeps blood moving well through both of your legs. ? You may sit for as long as you feel comfortable.  After bed rest: ? Avoid strenuous activities for as long as directed by your health care provider. ? Return to your normal activities as directed by your health care provider. Ask your health care provider what activities are safe for you.  Use items to help you with your activities, such as: ? A long-handled shoehorn to help you put your shoes on. ? Elastic shoelaces that do not need to be retied. ? A reacher or grabber to pick items up off the floor. General instructions  Do not  drive or operate heavy machinery until your health care provider tells you it is safe to do so.  Use a wheelchair or assistive devices as directed by your health care provider. When you are  ready to walk, start by using crutches or a walker to help support your body weight.  Have someone help you at home as you recover.  Wear compression stockings as told by your health care provider.  Do not use any products that contain nicotine or tobacco, such as cigarettes and e-cigarettes. These can delay bone healing. If you need help quitting, ask your health care provider.  If you have an underlying condition that caused your pelvic fracture, work with your health care provider to manage your condition.  Keep all follow-up visits as told by your health care provider. This is  important.   Contact a health care provider if:  Your pain gets worse.  Your pain is not relieved with medicines. Get help right away if you:  Feel light-headed or faint.  Develop chest pain.  Develop shortness of breath.  Have a fever.  Have blood in your urine or your stools.  Have bleeding in your vagina.  Have difficulty or pain with urination or with passing stool.  Have difficulty or increased pain with walking.  Have new or increased swelling in one of your legs.  Have numbness in your legs or groin area. Summary  A pelvic fracture is a break in one of the bones in the pelvis. These are the bones that you sit on and the bones that make up the lower part of your spine.  A pelvic fracture is called simple if there is only one break, the broken bone is stable, the broken bone is not moving out of place, or the bone does not pierce the skin.  Common causes of this type of fracture include a fall, a car accident, or a force or pressure that hits the pelvis.  The goal of treatment is to get the bone to heal in its original position.  Treatment includes bed rest and using a wheelchair. When ready to walk, you may use crutches or a walker until your bone heals. Other treatments include physical therapy and medicine to treat pain and prevent blood clots. This information is not intended to replace advice given to you by your health care provider. Make sure you discuss any questions you have with your health care provider. Document Revised: 03/28/2018 Document Reviewed: 10/01/2017 Elsevier Patient Education  2021 [employer].

## 2021-01-17 NOTE — Progress Notes (Signed)
Discharge instructions reviewed with the pt including medications, f/u appointment to be made by the pt, activity and diet. Also, discussed I/o cathing. The pt has recently had prostate surgery in December, he has had urine retention since his fall. The pt said he is comfortable cathing himself and he just had " a urine stream started but I havent had enough to drink to make urine, once I get a 32 0z drink in me it will be fine" iv dc'd tip intact. All questions answered. Pt sitting in chair waiting for family to arrive for dc.

## 2021-01-19 NOTE — Discharge Summary (Signed)
[place] at [facility]   PATIENT NAME: Steven Warren    MR#:  [id number]  DATE OF BIRTH:  15-Dec-1950  DATE OF ADMISSION:  01/15/2021   ADMITTING PHYSICIAN: Andris Baumann, MD  DATE OF DISCHARGE: 01/17/2021  3:44 PM  PRIMARY CARE PHYSICIAN: [facility]   ADMISSION DIAGNOSIS:  Pelvic fracture (HCC) [S32.9XXA] Intractable pain [R52] Closed fracture of left pubis, unspecified portion of pubis, initial encounter (HCC) [S32.502A] DISCHARGE DIAGNOSIS:  Principal Problem:   Closed fracture of multiple pubic rami, initial encounter (HCC) Active Problems:   Accidental fall   HTN (hypertension)   Hypothyroidism   GERD (gastroesophageal reflux disease)   Leukocytosis   Acute urinary retention   Closed fracture of left pubis (HCC)   Intractable pain  SECONDARY DIAGNOSIS:   Past Medical History:  Diagnosis Date  . GERD (gastroesophageal reflux disease)   . HLD (hyperlipidemia)   . HTN (hypertension)   . Hypothyroidism    HOSPITAL COURSE:  70 year old who fell and admitted for pubic rami #.  He lives in the [facility] area and is active and ambulatory without assistive device.  He has had prior orthopedic problems of bilateral biceps tendon ruptures but otherwise active.  He works full-time running a business.  He reports significant left groin pain after his fall. Seen by ortho here.  Minimally displaced comminuted pubic rami fractures on the left side near the pubic symphysis. Ortho recommends weightbearing as tolerated and use walker. Healing can take about a month to be comfortable and he can follow-up with his local orthopedist at [place] regarding this in 3 to 4 weeks.   DISCHARGE CONDITIONS:  stable CONSULTS OBTAINED:  Treatment Team:  Kennedy Bucker, MD DRUG ALLERGIES:  No Known Allergies DISCHARGE MEDICATIONS:   Allergies as of 01/17/2021   No Known Allergies     Medication List    TAKE these medications    amLODipine-benazepril 10-20 MG capsule Commonly known as: LOTREL Take 1 capsule by mouth daily.   atorvastatin 80 MG tablet Commonly known as: LIPITOR Take 40 mg by mouth daily.   gabapentin 300 MG capsule Commonly known as: NEURONTIN Take 1 capsule by mouth 3 (three) times daily.   hydrochlorothiazide 25 MG tablet Commonly known as: HYDRODIURIL Take 1 tablet by mouth daily.   ibuprofen 800 MG tablet Commonly known as: ADVIL Take 1 tablet (800 mg total) by mouth every 8 (eight) hours as needed.   levothyroxine 88 MCG tablet Commonly known as: SYNTHROID Take 88 mcg by mouth daily.   nystatin 100000 UNIT/ML suspension Commonly known as: MYCOSTATIN Take by mouth.   omeprazole 40 MG capsule Commonly known as: PRILOSEC Take 1 capsule by mouth daily.   traMADol 50 MG tablet Commonly known as: Ultram Take 1 tablet (50 mg total) by mouth every 12 (twelve) hours as needed for up to 5 days for severe pain.      DISCHARGE INSTRUCTIONS:   DIET:  Regular diet DISCHARGE CONDITION:  Stable ACTIVITY:  Activity as tolerated OXYGEN:  Home Oxygen: No.  Oxygen Delivery: room air DISCHARGE LOCATION:  Home with HHPT   If you experience worsening of your admission symptoms, develop shortness of breath, life threatening emergency, suicidal or homicidal thoughts you must seek medical attention immediately by calling 911 or calling your MD immediately  if symptoms less severe.  You Must read complete instructions/literature along with all the possible adverse reactions/side effects for all the Medicines you take and that have been prescribed  to you. Take any new Medicines after you have completely understood and accpet all the possible adverse reactions/side effects.   Please note  You were cared for by a hospitalist during your hospital stay. If you have any questions about your discharge medications or the care you received while you were in the hospital after you are discharged,  you can call the unit and asked to speak with the hospitalist on call if the hospitalist that took care of you is not available. Once you are discharged, your primary care physician will handle any further medical issues. Please note that NO REFILLS for any discharge medications will be authorized once you are discharged, as it is imperative that you return to your primary care physician (or establish a relationship with a primary care physician if you do not have one) for your aftercare needs so that they can reassess your need for medications and monitor your lab values.    On the day of Discharge:  VITAL SIGNS:  Blood pressure (!) 149/94, pulse 72, temperature 98.3 F (36.8 C), resp. rate 20, height 5\' 8"  (1.727 m), weight 76.2 kg, SpO2 98 %. PHYSICAL EXAMINATION:  GENERAL:  70 y.o.-year-old patient lying in the bed with no acute distress.  EYES: Pupils equal, round, reactive to light and accommodation. No scleral icterus. Extraocular muscles intact.  HEENT: Head atraumatic, normocephalic. Oropharynx and nasopharynx clear.  NECK:  Supple, no jugular venous distention. No thyroid enlargement, no tenderness.  LUNGS: Normal breath sounds bilaterally, no wheezing, rales,rhonchi or crepitation. No use of accessory muscles of respiration.  CARDIOVASCULAR: S1, S2 normal. No murmurs, rubs, or gallops.  ABDOMEN: Soft, non-tender, non-distended. Bowel sounds present. No organomegaly or mass.  EXTREMITIES: No pedal edema, cyanosis, or clubbing.  NEUROLOGIC: Cranial nerves II through XII are intact. Muscle strength 5/5 in all extremities. Sensation intact. Gait not checked.  PSYCHIATRIC: The patient is alert and oriented x 3.  SKIN: No obvious rash, lesion, or ulcer.  DATA REVIEW:   CBC Recent Labs  Lab 01/17/21 0448  WBC 8.5  HGB 13.9  HCT 40.1  PLT 248    Chemistries  Recent Labs  Lab 01/17/21 0448  NA 134*  K 3.9  CL 101  CO2 27  GLUCOSE 112*  BUN 13  CREATININE 0.85  CALCIUM 9.4      Outpatient follow-up  Follow-up Information    [place], [facility]. Schedule an appointment as soon as possible for a visit in 1 week(s).   Specialty: Internal Medicine Contact information: [address] [place]  [place] [phone]                  Management plans discussed with the patient, family and they are in agreement.  CODE STATUS: Prior   TOTAL TIME TAKING CARE OF THIS PATIENT: 45 minutes.    [phone] M.D on 01/19/2021 at 7:50 AM  [facility]   CC: Primary care physician; 01/21/2021 [facility]   Note: This dictation was prepared with Dragon dictation along with smaller phrase technology. Any transcriptional errors that result from this process are unintentional.

## 2021-01-27 NOTE — Progress Notes (Addendum)
[facility]  Physical Therapy Certification  Patient Details  Name: Steven Warren MRN: [id number] Date of Birth: 1951-04-11 Medical Diagnosis: Principal Problem:   Closed fracture of multiple pubic rami, initial encounter [facility]) Active Problems:   Accidental fall   HTN (hypertension)   Hypothyroidism   GERD (gastroesophageal reflux disease)   Leukocytosis   Acute urinary retention   Closed fracture of left pubis (HCC)   Intractable pain  Visit Diagnosis: PT Visit Diagnosis: Unsteadiness on feet (R26.81),Pain,History of falling (Z91.81),Difficulty in walking, not elsewhere classified (R26.2) Pain - Right/Left: Left Pain - part of body: Hip PT Problem: Decreased activity tolerance,Decreased mobility,Decreased strength,Decreased balance  Goals: Pt will Roll Supine to Side: with modified independence Pt will go Supine/Side to Sit: with modified independence Patient will transfer sit to/from stand: with modified independence (RW) Pt will Transfer Bed to Chair/Chair to Bed: with supervision Pt will Ambulate: 100 feet,with min guard assist,with rolling walker Pt will Go Up / Down Stairs: 1-2 stairs,with min guard assist  Duration: Services will be provided through the following date: 01/30/21  Frequency: 7X/week  Amount: one treatment session per day unless otherwise indicated.    Certification Start Date: 01/16/2021 Certification End Date: 01/30/21   PT Treatments/Interventions: Stair training,Balance training,Patient/family education,Therapeutic exercise,Gait training,Therapeutic activities  Lauriann Milillo H. Manson Passey, PT, DPT, NCS 01/27/21, 9:02 AM [phone]

## 2021-12-27 IMAGING — CR DG HIP (WITH OR WITHOUT PELVIS) 2-3V*L*
3 series · 3 of 3 positions shown · non-contrast
Comparison: None.

CLINICAL DATA: Fall with hip pain

EXAM:
DG HIP (WITH OR WITHOUT PELVIS) 2-3V LEFT

[pelvis ap]
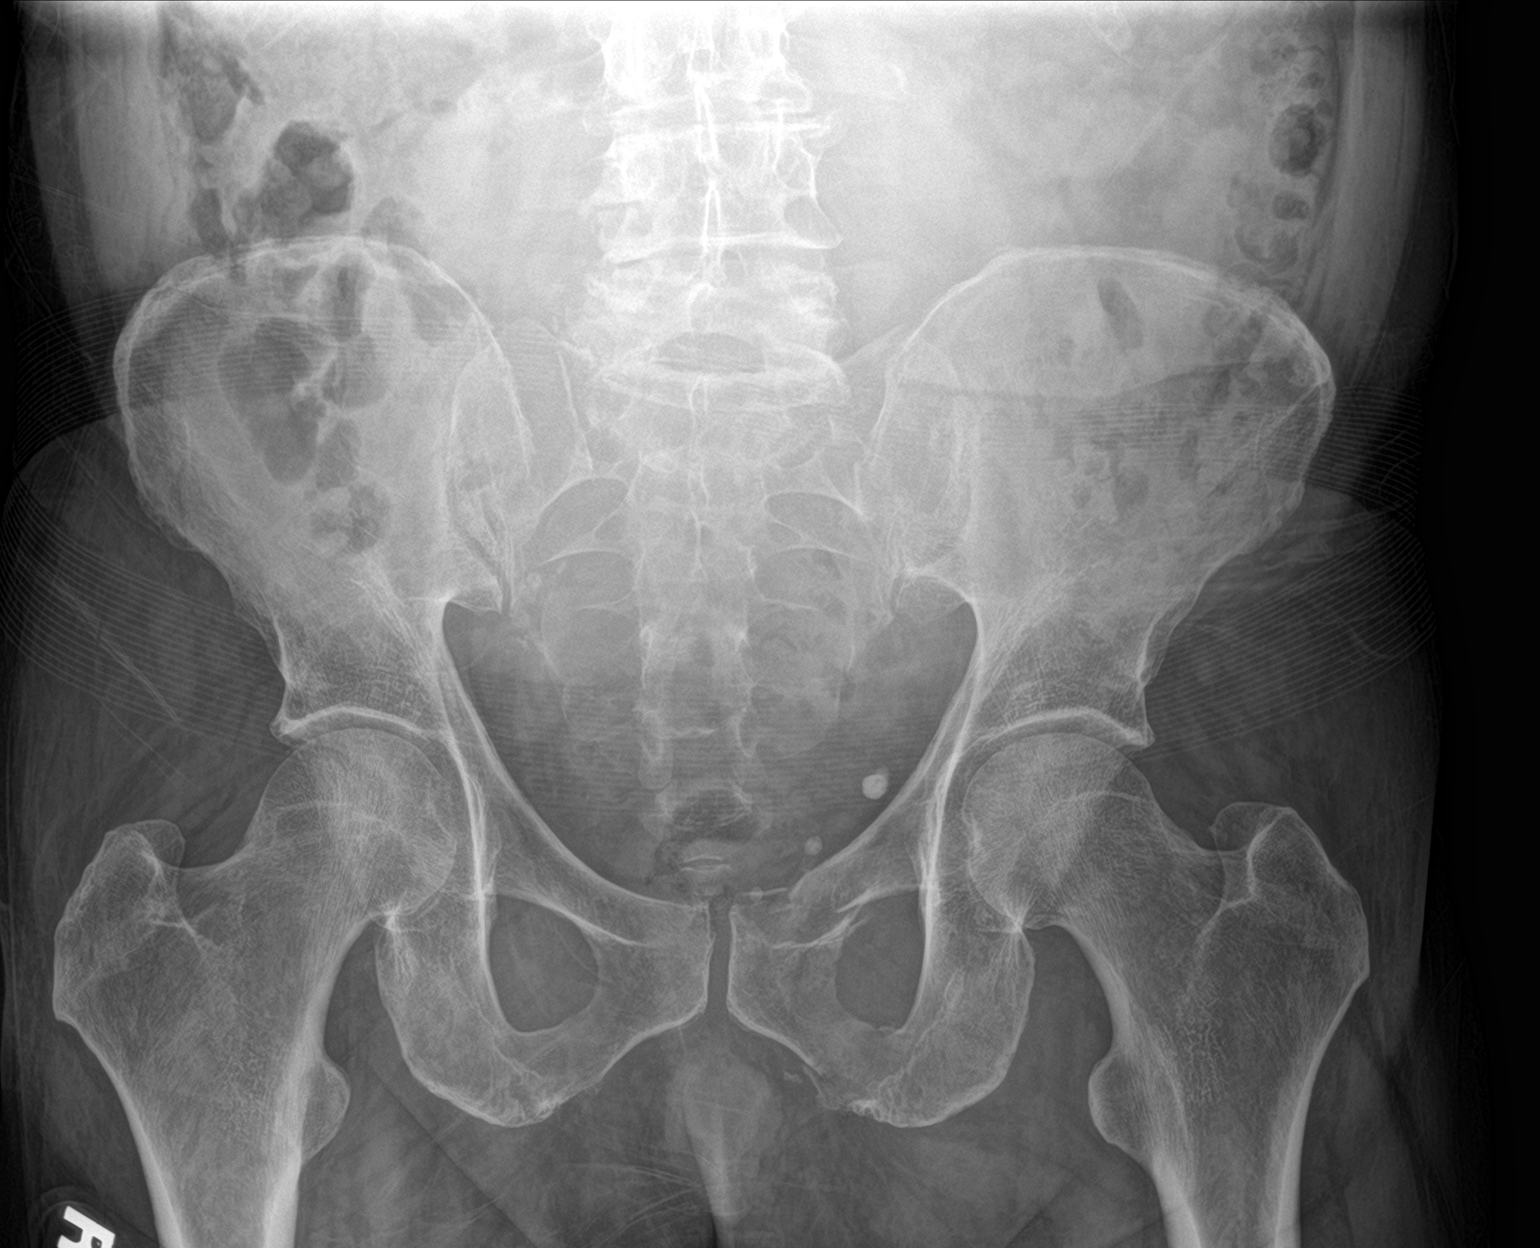

[hip ap]
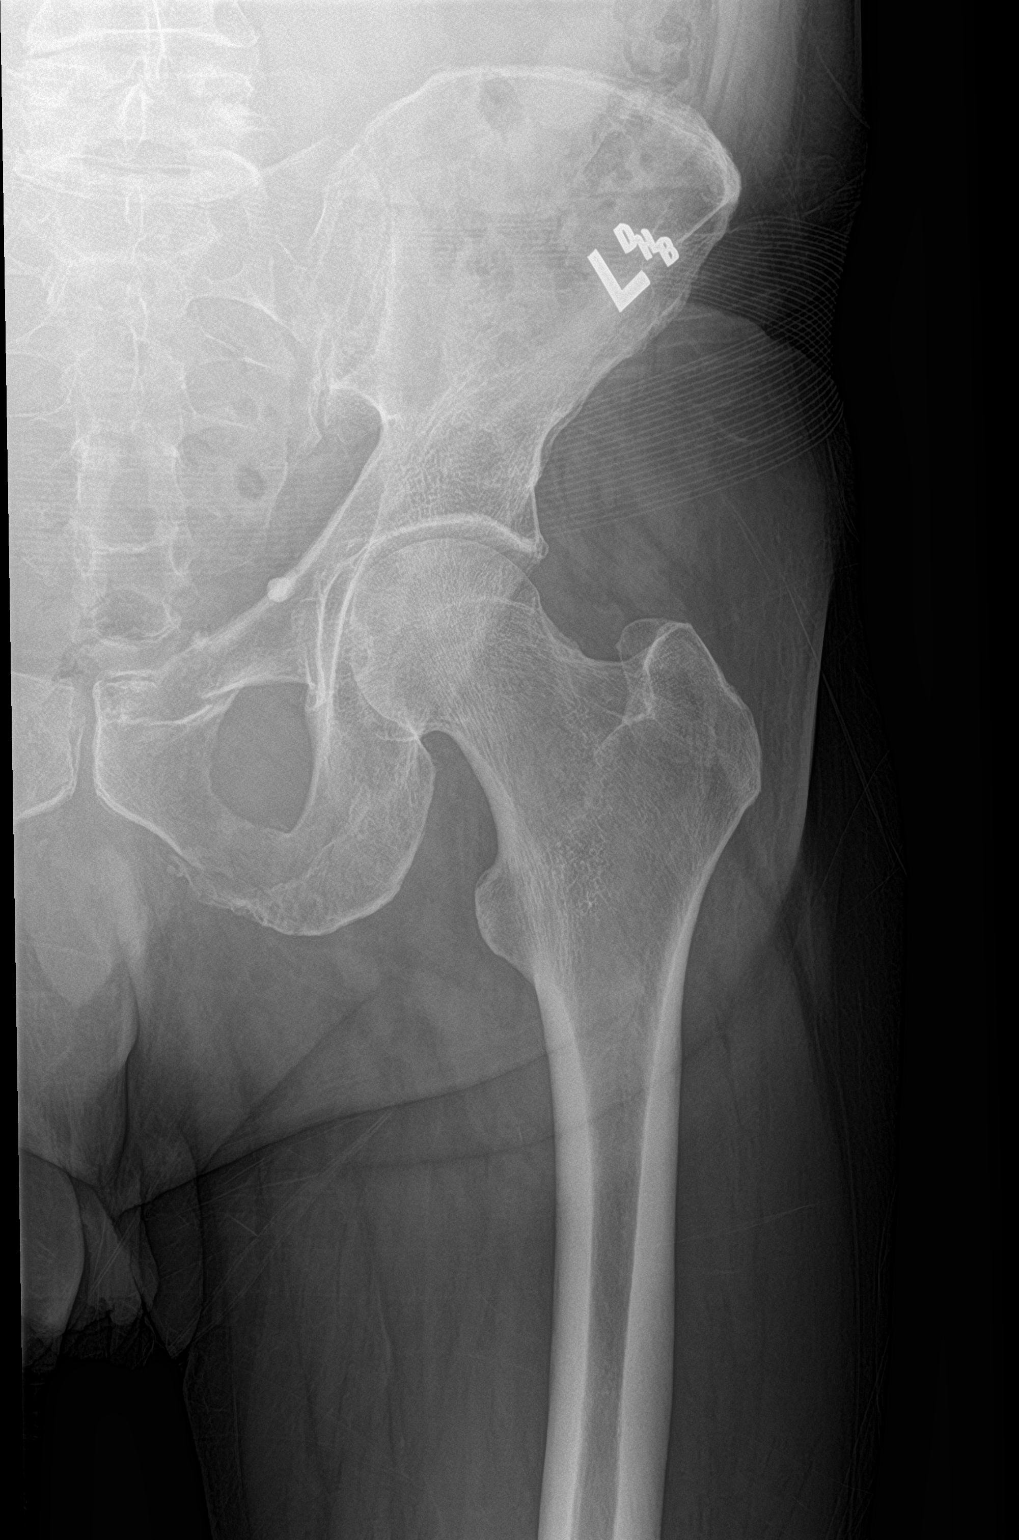

[hip lat]
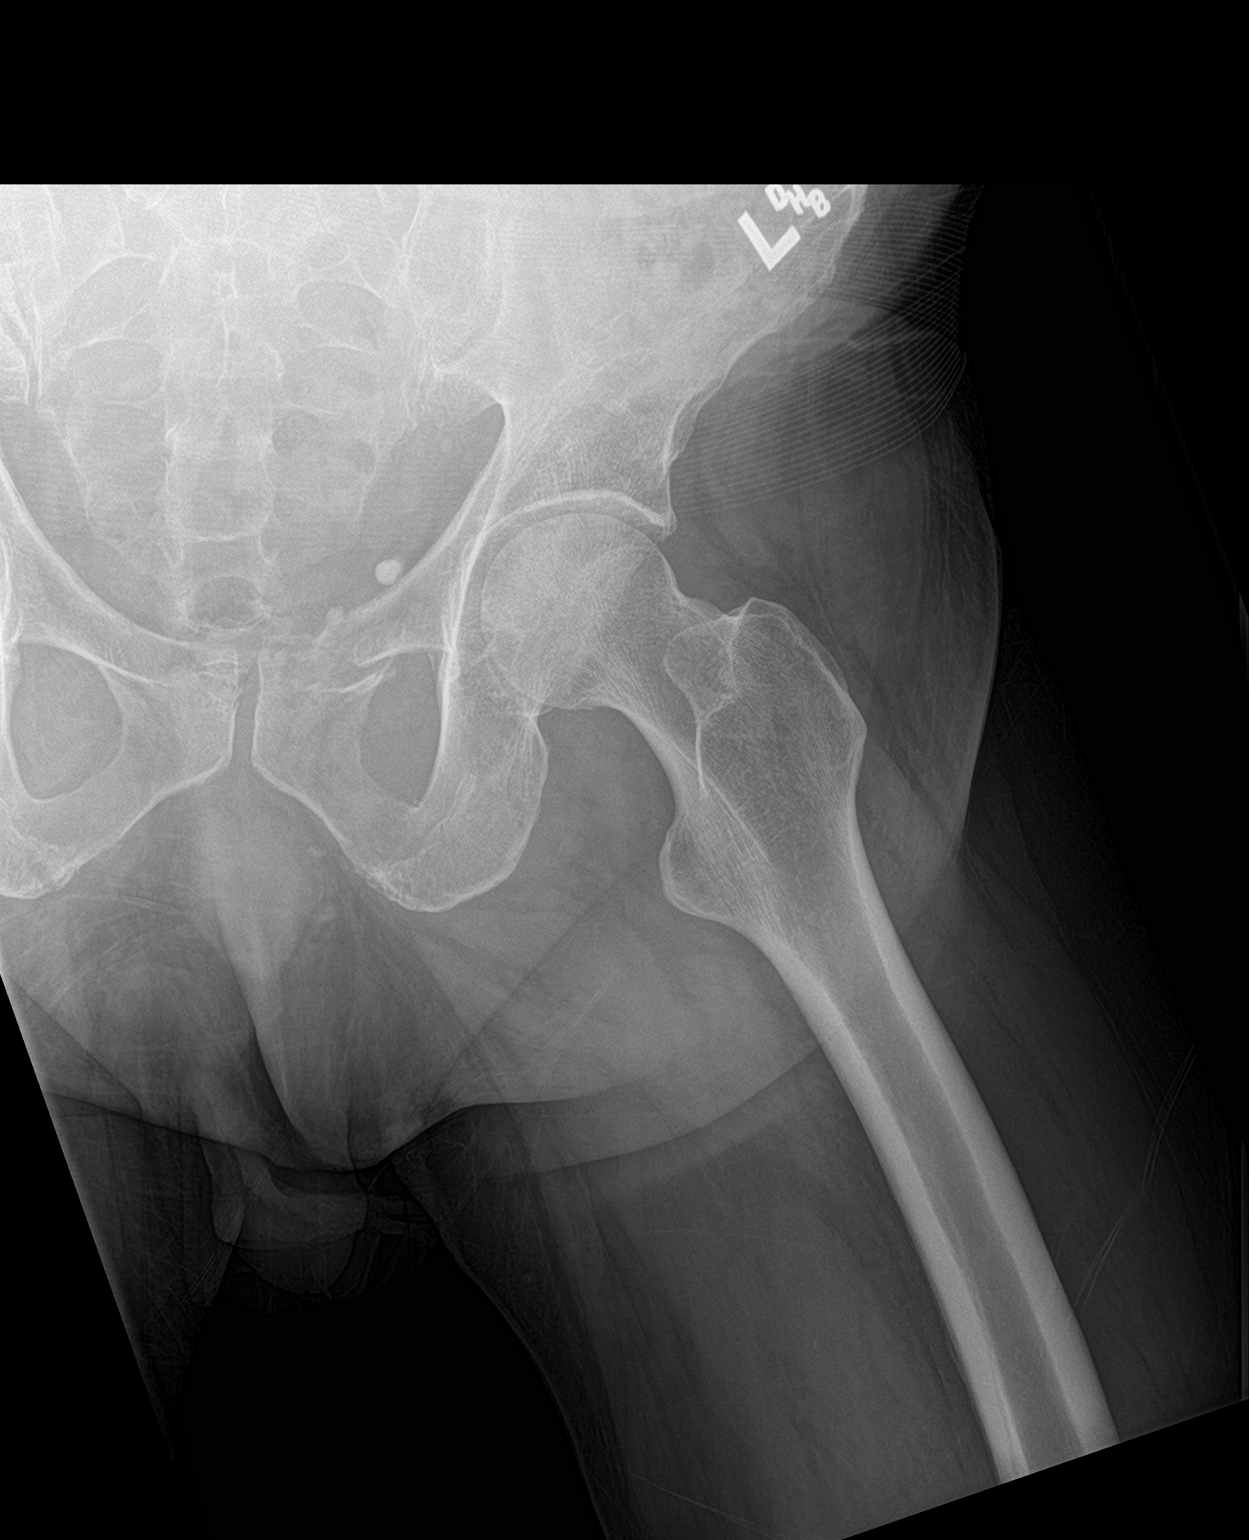

[3 of 3 positions shown; findings below may reference images not displayed]

FINDINGS: Minimally displaced fracture of the left superior pubic ramus and
nondisplaced fracture of the left inferior pubic ramus. There is no
evidence of arthropathy or other focal bone abnormality. Pelvic
phleboliths. Lumbar spondylosis.
IMPRESSION: Minimally displaced fracture of the left superior pubic ramus and
nondisplaced fracture of the left inferior pubic ramus.
